# Patient Record
Sex: Female | Born: 1970 | Race: Black or African American | Hispanic: No | Marital: Married | Smoking: Never smoker
Health system: Southern US, Community
[De-identification: ages and names within clinical notes are randomized; demographics above are authoritative.]

## PROBLEM LIST (undated history)

## (undated) DIAGNOSIS — R569 Unspecified convulsions: Secondary | ICD-10-CM

---

## 2015-07-05 ENCOUNTER — Emergency Department (HOSPITAL_COMMUNITY)
Admission: EM | Admit: 2015-07-05 | Discharge: 2015-07-05 | Disposition: A | Discharge status: Home / Self Care | Payer: Self-pay | Attending: Emergency Medicine | Admitting: Emergency Medicine

## 2015-07-05 ENCOUNTER — Encounter (HOSPITAL_COMMUNITY): Payer: Self-pay | Admitting: Emergency Medicine

## 2015-07-05 ENCOUNTER — Emergency Department (HOSPITAL_COMMUNITY): Payer: Self-pay

## 2015-07-05 DIAGNOSIS — F1310 Sedative, hypnotic or anxiolytic abuse, uncomplicated: Secondary | ICD-10-CM | POA: Insufficient documentation

## 2015-07-05 DIAGNOSIS — R569 Unspecified convulsions: Secondary | ICD-10-CM | POA: Insufficient documentation

## 2015-07-05 DIAGNOSIS — Z3202 Encounter for pregnancy test, result negative: Secondary | ICD-10-CM | POA: Insufficient documentation

## 2015-07-05 DIAGNOSIS — M545 Low back pain, unspecified: Secondary | ICD-10-CM

## 2015-07-05 HISTORY — DX: Unspecified convulsions: R56.9

## 2015-07-05 LAB — URINALYSIS, ROUTINE W REFLEX MICROSCOPIC
BILIRUBIN URINE: NEGATIVE
Glucose, UA: NEGATIVE mg/dL
Hgb urine dipstick: NEGATIVE
Ketones, ur: NEGATIVE mg/dL
LEUKOCYTES UA: NEGATIVE
NITRITE: NEGATIVE
PH: 6.5 (ref 5.0–8.0)
Protein, ur: NEGATIVE mg/dL
SPECIFIC GRAVITY, URINE: 1.016 (ref 1.005–1.030)
UROBILINOGEN UA: 1 mg/dL (ref 0.0–1.0)

## 2015-07-05 LAB — CBC WITH DIFFERENTIAL/PLATELET
BASOS PCT: 0 %
Basophils Absolute: 0 10*3/uL (ref 0.0–0.1)
EOS ABS: 0.1 10*3/uL (ref 0.0–0.7)
EOS PCT: 2 %
HCT: 36.6 % (ref 36.0–46.0)
HEMOGLOBIN: 11.9 g/dL — AB (ref 12.0–15.0)
LYMPHS ABS: 2.1 10*3/uL (ref 0.7–4.0)
Lymphocytes Relative: 45 %
MCH: 28.6 pg (ref 26.0–34.0)
MCHC: 32.5 g/dL (ref 30.0–36.0)
MCV: 88 fL (ref 78.0–100.0)
MONOS PCT: 6 %
Monocytes Absolute: 0.3 10*3/uL (ref 0.1–1.0)
NEUTROS PCT: 47 %
Neutro Abs: 2.2 10*3/uL (ref 1.7–7.7)
PLATELETS: 245 10*3/uL (ref 150–400)
RBC: 4.16 MIL/uL (ref 3.87–5.11)
RDW: 14.5 % (ref 11.5–15.5)
WBC: 4.7 10*3/uL (ref 4.0–10.5)

## 2015-07-05 LAB — BASIC METABOLIC PANEL
Anion gap: 9 (ref 5–15)
BUN: 6 mg/dL (ref 6–20)
CALCIUM: 8.4 mg/dL — AB (ref 8.9–10.3)
CO2: 24 mmol/L (ref 22–32)
CREATININE: 0.75 mg/dL (ref 0.44–1.00)
Chloride: 106 mmol/L (ref 101–111)
Glucose, Bld: 122 mg/dL — ABNORMAL HIGH (ref 65–99)
Potassium: 3.9 mmol/L (ref 3.5–5.1)
SODIUM: 139 mmol/L (ref 135–145)

## 2015-07-05 LAB — RAPID URINE DRUG SCREEN, HOSP PERFORMED
Amphetamines: NOT DETECTED
Barbiturates: POSITIVE — AB
Benzodiazepines: POSITIVE — AB
Cocaine: NOT DETECTED
Opiates: NOT DETECTED
Tetrahydrocannabinol: NOT DETECTED

## 2015-07-05 LAB — PREGNANCY, URINE: Preg Test, Ur: NEGATIVE

## 2015-07-05 MED ORDER — MORPHINE SULFATE (PF) 4 MG/ML IV SOLN
4.0000 mg | Freq: Once | INTRAVENOUS | Status: AC
  Administered 2015-07-05: 4 mg via INTRAVENOUS
  Filled 2015-07-05: qty 1

## 2015-07-05 MED ORDER — PHENOBARBITAL 97.2 MG PO TABS
97.2000 mg | ORAL_TABLET | Freq: Once | ORAL | Status: AC
  Administered 2015-07-05: 97.2 mg via ORAL
  Filled 2015-07-05: qty 1

## 2015-07-05 MED ORDER — PROMETHAZINE HCL 25 MG/ML IJ SOLN
25.0000 mg | Freq: Once | INTRAMUSCULAR | Status: AC
  Administered 2015-07-05: 25 mg via INTRAVENOUS
  Filled 2015-07-05: qty 1

## 2015-07-05 MED ORDER — LEVETIRACETAM 750 MG PO TABS
750.0000 mg | ORAL_TABLET | Freq: Once | ORAL | Status: AC
  Administered 2015-07-05: 750 mg via ORAL
  Filled 2015-07-05: qty 1

## 2015-07-05 MED ORDER — SODIUM CHLORIDE 0.9 % IV BOLUS (SEPSIS)
1000.0000 mL | Freq: Once | INTRAVENOUS | Status: AC
  Administered 2015-07-05: 1000 mL via INTRAVENOUS

## 2015-07-05 NOTE — ED Notes (Signed)
Attempted to draw blood 1 attempt. RN Elsie Ra notified.

## 2015-07-05 NOTE — Discharge Instructions (Signed)
Take your seizure medication as directed. Follow up with your doctor for further evaluation.

## 2015-07-05 NOTE — ED Notes (Signed)
Patient is slurring words while talking. Patient is requesting more morphine. Patient states that she has percocet at home. She states that she pays this bill out of her pocket and should not be leaving the hospital in pain. She also stated that  of morphine is not enough. Patient wants [place] to give  of morphine. Patient was told she could have percocets but she stated she did not want that because it does not help.

## 2015-07-05 NOTE — ED Notes (Signed)
Pt.'s sister Armando Reichert cell no. [phone] to call once pt. Is ready for discharge.

## 2015-07-05 NOTE — ED Notes (Signed)
Family states patient had seizure. Patient was alert and oriented when EMS arrived. Patient stated she is having back pain.

## 2015-07-05 NOTE — ED Notes (Signed)
Patient stated that she called the doctor a white -------.

## 2015-07-05 NOTE — ED Provider Notes (Signed)
CSN: [id number]     Arrival date & time 07/05/15  0112 History   First MD Initiated Contact with Patient 07/05/15 0203     Chief Complaint  Patient presents with  . Seizures    Family states patient had seizure. Patient was alert and oriented when EMS arrived. Patient stated she is having back pain.      (Consider location/radiation/quality/duration/timing/severity/associated sxs/prior Treatment) Patient is a 44 y.o. female presenting with seizures. The history is provided by the patient and a relative. No language interpreter was used.  Seizures Seizure activity on arrival: no   Seizure type:  Grand mal Preceding symptoms: no sensation of an aura present, no dizziness, no euphoria, no headache, no hyperventilation, no nausea, no numbness, no panic and no vision change   Initial focality:  None Episode characteristics: generalized shaking   Postictal symptoms: no confusion, no memory loss and no somnolence   Return to baseline: yes   Severity:  Moderate Duration:  2 minutes Timing:  Once Number of seizures this episode:  1 Progression:  Resolved Context: not alcohol withdrawal, not developmental delay, not emotional upset, not family hx of seizures, not fever, not hydrocephalus, not intracranial lesion, not intracranial shunt, medical compliance, not possible medication ingestion, not pregnant and not stress   Context comment:  Missed a medication dose Recent head injury:  No recent head injuries PTA treatment:  None History of seizures: yes     Past Medical History  Diagnosis Date  . Seizure    No past surgical history on file. History reviewed. No pertinent family history. Social History  Substance Use Topics  . Smoking status: Never Smoker   . Smokeless tobacco: None  . Alcohol Use: No   OB History    No data available     Review of Systems  Neurological: Positive for seizures.  All other systems reviewed and are negative.     Allergies  Sulfa  antibiotics  Home Medications   Prior to Admission medications   Not on File   BP 113/73 mmHg  Pulse 72  Temp(Src) 97 F (36.1 C) (Oral)  Resp 20  SpO2 100%  LMP 06/03/2015 Physical Exam  Constitutional: She is oriented to person, place, and time. She appears well-developed and well-nourished. No distress.  HENT:  Head: Normocephalic and atraumatic.  Eyes: Conjunctivae and EOM are normal. Pupils are equal, round, and reactive to light.  Neck: Normal range of motion.  Cardiovascular: Normal rate and regular rhythm.  Exam reveals no gallop and no friction rub.   No murmur heard. Pulmonary/Chest: Effort normal and breath sounds normal. She has no wheezes. She has no rales. She exhibits no tenderness.  Abdominal: Soft. She exhibits no distension. There is no tenderness. There is no rebound.  Musculoskeletal: Normal range of motion.  Midline spine tenderness to palpation of entire spine.   Neurological: She is alert and oriented to person, place, and time. No cranial nerve deficit. Coordination normal.  Speech is goal-oriented. Moves limbs without ataxia.   Skin: Skin is warm and dry.  Psychiatric: She has a normal mood and affect. Her behavior is normal.  Nursing note and vitals reviewed.   ED Course  Procedures (including critical care time) Labs Review Labs Reviewed  CBC WITH DIFFERENTIAL/PLATELET - Abnormal; Notable for the following:    Hemoglobin 11.9 (*)    All other components within normal limits  BASIC METABOLIC PANEL - Abnormal; Notable for the following:    Glucose, Bld 122 (*)  Calcium 8.4 (*)    All other components within normal limits  URINE RAPID DRUG SCREEN, HOSP PERFORMED - Abnormal; Notable for the following:    Benzodiazepines POSITIVE (*)    Barbiturates POSITIVE (*)    All other components within normal limits  URINALYSIS, ROUTINE W REFLEX MICROSCOPIC (NOT AT [facility])  PREGNANCY, URINE    Imaging Review Dg Cervical Spine Complete  07/05/2015    CLINICAL DATA:  Neck pain after seizure and fall.  EXAM: CERVICAL SPINE  4+ VIEWS  COMPARISON:  None.  FINDINGS: Mild straightening of normal lordosis. Vertebral body heights are preserved. The dens is intact. Posterior elements appear well-aligned. There is no evidence of fracture. Disc space narrowing and endplate spurring at C4-C5, C5-C6, and C6-C7. No prevertebral soft tissue edema.  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 No evidence of fracture or subluxation.   Electronically Signed   By: Rubye Oaks M.D.   On: 07/05/2015 05:05   Dg Thoracic Spine 2 View  07/05/2015   CLINICAL DATA:  Thoracic back pain after seizure, fall.  EXAM: THORACIC SPINE 2 VIEWS  COMPARISON:  None.  FINDINGS: Upper thoracic spine partially obscured by soft tissue attenuation from body habitus. The alignment is maintained. Vertebral body heights are maintained. Minimal disc space narrowing. Posterior elements appear intact. There is no paravertebral soft tissue abnormality.  IMPRESSION: No fracture or subluxation of the thoracic spine.   Electronically Signed   By: Rubye Oaks M.D.   On: 07/05/2015 05:06   Dg Lumbar Spine Complete  07/05/2015   CLINICAL DATA:  Lumbar spine pain after seizure, fall.  EXAM: LUMBAR SPINE - COMPLETE 4+ VIEW  COMPARISON:  None.  FINDINGS: The alignment is maintained. Vertebral body heights are normal. Question of bilateral L5 pars interarticularis defects without listhesis. The posterior elements are intact. Disc spaces are preserved. No fracture. Sacroiliac joints are symmetric and normal.  IMPRESSION: 1. No acute fracture or subluxation of the lumbar spine. 2. Questionable bilateral L5 pars interarticularis defects without listhesis.   Electronically Signed   By: Rubye Oaks M.D.   On: 07/05/2015 05:07   Dg Shoulder Right  07/05/2015   CLINICAL DATA:  Right shoulder pain.  Post seizure.  Fall.  EXAM: RIGHT SHOULDER - 2+ VIEW  COMPARISON:  None.  FINDINGS: No  fracture or dislocation. There is proliferative change at the acromioclavicular joint. No abnormal soft tissue calcifications.  IMPRESSION: No acute fracture or dislocation.   Electronically Signed   By: Rubye Oaks M.D.   On: 07/05/2015 05:03   I have personally reviewed and evaluated these images and lab results as part of my medical decision-making.   EKG Interpretation None      MDM   Final diagnoses:  Seizure  Midline low back pain without sciatica    2:32 AM Labs pending. Vitals stable and patient afebrile.   5:47 AM Patient complaining of persistent back pain. Patient received her seizure medication as well as phenergan and morphine for pain. Patient is sleepy and slurring words. She loses her train of thought often during conversation and became emotional and started to cry during our conversation. I do not feel comfortable giving her additional pain medication because of her response to the first dose. Patient states she cannot take tylenol or ibuprofen. Patient's xrays and labs unremarkable. Patient will be discharged without further evaluation. Vitals stable and patient afebrile.     [id number] [place], PA-C 07/05/15 1610  Geoffery Lyons, MD 07/05/15 9604  Riley Lam  [place], MD 07/05/15 [phone]

## 2015-07-05 NOTE — ED Notes (Signed)
Bed: WA12 Expected date:  Expected time:  Means of arrival:  Comments: Ems : seizure 

## 2015-07-12 ENCOUNTER — Encounter (HOSPITAL_COMMUNITY): Payer: Self-pay | Admitting: *Deleted

## 2015-07-12 ENCOUNTER — Encounter (HOSPITAL_COMMUNITY): Payer: Self-pay | Admitting: Emergency Medicine

## 2015-07-12 ENCOUNTER — Emergency Department (HOSPITAL_COMMUNITY)
Admission: EM | Admit: 2015-07-12 | Discharge: 2015-07-12 | Disposition: A | Discharge status: Home / Self Care | Payer: Self-pay | Attending: Emergency Medicine | Admitting: Emergency Medicine

## 2015-07-12 DIAGNOSIS — Z79899 Other long term (current) drug therapy: Secondary | ICD-10-CM | POA: Insufficient documentation

## 2015-07-12 DIAGNOSIS — G40909 Epilepsy, unspecified, not intractable, without status epilepticus: Secondary | ICD-10-CM | POA: Insufficient documentation

## 2015-07-12 DIAGNOSIS — F1310 Sedative, hypnotic or anxiolytic abuse, uncomplicated: Secondary | ICD-10-CM | POA: Insufficient documentation

## 2015-07-12 DIAGNOSIS — R110 Nausea: Secondary | ICD-10-CM | POA: Insufficient documentation

## 2015-07-12 DIAGNOSIS — M549 Dorsalgia, unspecified: Secondary | ICD-10-CM | POA: Insufficient documentation

## 2015-07-12 DIAGNOSIS — R569 Unspecified convulsions: Secondary | ICD-10-CM | POA: Insufficient documentation

## 2015-07-12 DIAGNOSIS — R404 Transient alteration of awareness: Secondary | ICD-10-CM | POA: Insufficient documentation

## 2015-07-12 LAB — BASIC METABOLIC PANEL
ANION GAP: 6 (ref 5–15)
BUN: 5 mg/dL — AB (ref 6–20)
CALCIUM: 8.6 mg/dL — AB (ref 8.9–10.3)
CO2: 24 mmol/L (ref 22–32)
Chloride: 106 mmol/L (ref 101–111)
Creatinine, Ser: 0.87 mg/dL (ref 0.44–1.00)
GFR calc Af Amer: >60 mL/min (ref >60)
GLUCOSE: 95 mg/dL (ref 65–99)
Potassium: 4 mmol/L (ref 3.5–5.1)
SODIUM: 136 mmol/L (ref 135–145)

## 2015-07-12 LAB — CBC WITH DIFFERENTIAL/PLATELET
BASOS ABS: 0 10*3/uL (ref 0.0–0.1)
BASOS PCT: 0 %
EOS ABS: 0.1 10*3/uL (ref 0.0–0.7)
EOS PCT: 2 %
HCT: 37.3 % (ref 36.0–46.0)
Hemoglobin: 12.1 g/dL (ref 12.0–15.0)
LYMPHS PCT: 51 %
Lymphs Abs: 2 10*3/uL (ref 0.7–4.0)
MCH: 28.4 pg (ref 26.0–34.0)
MCHC: 32.4 g/dL (ref 30.0–36.0)
MCV: 87.6 fL (ref 78.0–100.0)
MONO ABS: 0.2 10*3/uL (ref 0.1–1.0)
Monocytes Relative: 6 %
Neutro Abs: 1.6 10*3/uL — ABNORMAL LOW (ref 1.7–7.7)
Neutrophils Relative %: 41 %
PLATELETS: 262 10*3/uL (ref 150–400)
RBC: 4.26 MIL/uL (ref 3.87–5.11)
RDW: 14.2 % (ref 11.5–15.5)
WBC: 3.9 10*3/uL — AB (ref 4.0–10.5)

## 2015-07-12 LAB — RAPID URINE DRUG SCREEN, HOSP PERFORMED
Amphetamines: NOT DETECTED
BENZODIAZEPINES: POSITIVE — AB
Barbiturates: POSITIVE — AB
Cocaine: NOT DETECTED
Opiates: NOT DETECTED
Tetrahydrocannabinol: NOT DETECTED

## 2015-07-12 LAB — PHENOBARBITAL LEVEL: PHENOBARBITAL: 39.8 ug/mL (ref 15.0–40.0)

## 2015-07-12 LAB — CBG MONITORING, ED: GLUCOSE-CAPILLARY: 89 mg/dL (ref 65–99)

## 2015-07-12 MED ORDER — PROMETHAZINE HCL 25 MG/ML IJ SOLN
25.0000 mg | Freq: Once | INTRAMUSCULAR | Status: AC
  Administered 2015-07-12: 25 mg via INTRAVENOUS
  Filled 2015-07-12: qty 1

## 2015-07-12 MED ORDER — MORPHINE SULFATE (PF) 4 MG/ML IV SOLN
4.0000 mg | Freq: Once | INTRAVENOUS | Status: AC
  Administered 2015-07-12: 4 mg via INTRAVENOUS
  Filled 2015-07-12: qty 1

## 2015-07-12 NOTE — ED Provider Notes (Signed)
CSN: [id number]     Arrival date & time 07/12/15  1733 History   First MD Initiated Contact with Patient 07/12/15 1758     Chief Complaint  Patient presents with  . Re-evaluation    ? Seizure     (Consider location/radiation/quality/duration/timing/severity/associated sxs/prior Treatment) The history is provided by the patient.  Patient w hx seizure, c/o being discharged from ER earlier today. [place], and laid down on bench there. Patient was then brought by ems back to ED.  Pt states she was d/c too soon after getting morphine and phenergan (on review of records, received no meds earlier today, but did get those meds on prior ed visit 9/18).  Pt very difficult historian, states from [place], then states from [place], [place] - when asked about current address, [place], she seems confused.  Pt states she is visiting her 'fraternalized mother', part of the 'order of the [place]'.   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 Pt difficult historian, moves from one unrelated thought to next, uncooperative w many questions - level 5 caveat.      Past Medical History  Diagnosis Date  . Seizure    History reviewed. No pertinent past surgical history. No family history on file. Social History  Substance Use Topics  . Smoking status: Never Smoker   . Smokeless tobacco: None  . Alcohol Use: No   OB History    No data available     Review of Systems  Constitutional: Negative for fever and chills.  HENT: Negative for sore throat.   Eyes: Negative for redness.  Respiratory: Negative for shortness of breath.   Cardiovascular: Negative for chest pain.  Gastrointestinal: Negative for vomiting and abdominal pain.  Genitourinary: Negative for flank pain.  Musculoskeletal: Negative for back pain and neck pain.  Skin: Negative for rash.  Neurological: Negative for headaches.  Hematological: Does not bruise/bleed easily.  Psychiatric/Behavioral: Negative for suicidal  ideas and dysphoric mood.      Allergies  Sulfa antibiotics  Home Medications   Prior to Admission medications   Medication Sig Start Date End Date Taking? Authorizing Aletha Allebach  levETIRAcetam (KEPPRA) 500 MG tablet Take 750 mg by mouth 2 (two) times daily. 06/08/15   Historical Keondre Markson, MD  levETIRAcetam (KEPPRA) 750 MG tablet TK 2 TS PO BID 06/15/15   Historical Antoino Westhoff, MD  LORazepam (ATIVAN) 2 MG tablet TK 1 T PO TID 06/08/15   Historical Edem Tiegs, MD  meclizine (ANTIVERT) 25 MG tablet TK 1 TS PO TID 06/12/15   Historical Abbee Cremeens, MD  oxyCODONE-acetaminophen (PERCOCET) 10-325 MG per tablet TK 1 T PO TID 06/15/15   Historical Katasha Riga, MD  PHENObarbital (LUMINAL) 100 MG tablet TK 1 T PO BID 06/15/15   Historical Anil Havard, MD   BP 124/78 mmHg  Pulse 81  Temp(Src) 97.8 F (36.6 C) (Oral)  Resp 18  SpO2 100%  LMP 06/03/2015 (LMP Unknown) Physical Exam  Constitutional: She appears well-developed and well-nourished. No distress.  HENT:  Head: Atraumatic.  Eyes: Conjunctivae are normal. Pupils are equal, round, and reactive to light. No scleral icterus.  Neck: Neck supple. No tracheal deviation present.  No stiffness or rigidity  Cardiovascular: Normal rate, regular rhythm, normal heart sounds and intact distal pulses.   Pulmonary/Chest: Effort normal and breath sounds normal. No respiratory distress.  Abdominal: Soft. Normal appearance and bowel sounds are normal. She exhibits no distension. There is no tenderness.  Musculoskeletal: She exhibits no edema.  Neurological: She is  alert.  Oriented to person, place. Ambulates w steady gait.  Skin: Skin is warm and dry. No rash noted. She is not diaphoretic.  Psychiatric:  Pt states irritated, upset. Denies feeling depressed, denies SI.       Nursing note and vitals reviewed.   ED Course  Procedures (including critical care time) Labs Review    Results for orders placed or performed during the hospital encounter of  07/12/15  Phenobarbital Level  (if patient is taking this medication)  Result Value Ref Range   Phenobarbital 39.8 15.0 - 40.0 ug/mL  CBC with Differential  Result Value Ref Range   WBC 3.9 (L) 4.0 - 10.5 K/uL   RBC 4.26 3.87 - 5.11 MIL/uL   Hemoglobin 12.1 12.0 - 15.0 g/dL   HCT 60.4 54.0 - 98.1 %   MCV 87.6 78.0 - 100.0 fL   MCH 28.4 26.0 - 34.0 pg   MCHC 32.4 30.0 - 36.0 g/dL   RDW 19.1 47.8 - 29.5 %   Platelets 262 150 - 400 K/uL   Neutrophils Relative % 41 %   Neutro Abs 1.6 (L) 1.7 - 7.7 K/uL   Lymphocytes Relative 51 %   Lymphs Abs 2.0 0.7 - 4.0 K/uL   Monocytes Relative 6 %   Monocytes Absolute 0.2 0.1 - 1.0 K/uL   Eosinophils Relative 2 %   Eosinophils Absolute 0.1 0.0 - 0.7 K/uL   Basophils Relative 0 %   Basophils Absolute 0.0 0.0 - 0.1 K/uL  Basic metabolic panel  Result Value Ref Range   Sodium 136 135 - 145 mmol/L   Potassium 4.0 3.5 - 5.1 mmol/L   Chloride 106 101 - 111 mmol/L   CO2 24 22 - 32 mmol/L   Glucose, Bld 95 65 - 99 mg/dL   BUN 5 (L) 6 - 20 mg/dL   Creatinine, Ser 6.21 0.44 - 1.00 mg/dL   Calcium 8.6 (L) 8.9 - 10.3 mg/dL   GFR calc non Af Amer >60 >60 mL/min   GFR calc Af Amer >60 >60 mL/min   Anion gap 6 5 - 15  Urine rapid drug screen (hosp performed)  Result Value Ref Range   Opiates NONE DETECTED NONE DETECTED   Cocaine NONE DETECTED NONE DETECTED   Benzodiazepines POSITIVE (A) NONE DETECTED   Amphetamines NONE DETECTED NONE DETECTED   Tetrahydrocannabinol NONE DETECTED NONE DETECTED   Barbiturates POSITIVE (A) NONE DETECTED  CBG monitoring, ED  Result Value Ref Range   Glucose-Capillary 89 65 - 99 mg/dL   Dg Cervical Spine Complete  07/05/2015   CLINICAL DATA:  Neck pain after seizure and fall.  EXAM: CERVICAL SPINE  4+ VIEWS  COMPARISON:  None.  FINDINGS: Mild straightening of normal lordosis. Vertebral body heights are preserved. The dens is intact. Posterior elements appear well-aligned. There is no evidence of fracture. Disc space  narrowing and endplate spurring at C4-C5, C5-C6, and C6-C7. No prevertebral soft tissue edema.  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 No evidence of fracture or subluxation.   Electronically Signed   By: Rubye Oaks M.D.   On: 07/05/2015 05:05   Dg Thoracic Spine 2 View  07/05/2015   CLINICAL DATA:  Thoracic back pain after seizure, fall.  EXAM: THORACIC SPINE 2 VIEWS  COMPARISON:  None.  FINDINGS: Upper thoracic spine partially obscured by soft tissue attenuation from body habitus. The alignment is maintained. Vertebral body heights are maintained. Minimal disc space narrowing. Posterior elements appear intact. There is no paravertebral  soft tissue abnormality.  IMPRESSION: No fracture or subluxation of the thoracic spine.   Electronically Signed   By: Rubye Oaks M.D.   On: 07/05/2015 05:06   Dg Lumbar Spine Complete  07/05/2015   CLINICAL DATA:  Lumbar spine pain after seizure, fall.  EXAM: LUMBAR SPINE - COMPLETE 4+ VIEW  COMPARISON:  None.  FINDINGS: The alignment is maintained. Vertebral body heights are normal. Question of bilateral L5 pars interarticularis defects without listhesis. The posterior elements are intact. Disc spaces are preserved. No fracture. Sacroiliac joints are symmetric and normal.  IMPRESSION: 1. No acute fracture or subluxation of the lumbar spine. 2. Questionable bilateral L5 pars interarticularis defects without listhesis.   Electronically Signed   By: Rubye Oaks M.D.   On: 07/05/2015 05:07   Dg Shoulder Right  07/05/2015   CLINICAL DATA:  Right shoulder pain.  Post seizure.  Fall.  EXAM: RIGHT SHOULDER - 2+ VIEW  COMPARISON:  None.  FINDINGS: No fracture or dislocation. There is proliferative change at the acromioclavicular joint. No abnormal soft tissue calcifications.  IMPRESSION: No acute fracture or dislocation.   Electronically Signed   By: Rubye Oaks M.D.   On: 07/05/2015 05:03      MDM   Reviewed nursing notes and prior  charts for additional history.   Recent labs reviewed.  Pt indicates she just needs help getting to home of friend, Durwin Nora, who is her friend and fraternal mother.  She provided number.  Pts contact reached, who verifies that she is expecting pt, but that she has no transportation to get pt to her home, states pt is welcome there.  SW contacted, will provide transportation for pt.  Vitals normal, pt denies mental or physical symptom or problem currently, and requests d/c.      Cathren Laine, MD 07/12/15 1904

## 2015-07-12 NOTE — ED Notes (Signed)
Pt stated to RN [place] that she woke up confused, had wet self, and felt like she had had a seizure. Patient stated to [facility] that she had a hx of seizure activity. Patient is alert, oriented x 4.

## 2015-07-12 NOTE — ED Provider Notes (Signed)
CSN: [id number]     Arrival date & time 07/12/15  0426 History   First MD Initiated Contact with Patient 07/12/15 0601     Chief Complaint  Patient presents with  . Seizures     (Consider location/radiation/quality/duration/timing/severity/associated sxs/prior Treatment) HPI Comments: Pt is a 44 yo female with PMH of seizures who presents to the ED via EMS with complaint of seizure. Pt states yesterday around 11am she started to have a HA which she reports is her "aura" and then went to lay down to try to go to sleep and fell asleep till 4am. She notes she woke up confused, had wet herself, had pain to her back and legs and states she "felt like I had a seizure". Endorses fatigue, back pain, leg pain, nausea. Denies fever, headache, dizziness, visual changes, slurred speech, weakness, numbness, tingling, SOB, CP, abdominal pain, vomiting, diarrhea, urinary sxs. Denies head injury. She reports that for the past 6 weeks she has only been taking 1/2 the dose of her Keppra and Phenobarbital due to not feeling right and it affecting her work when she takes the full dose prescribed by her neurologist. Pt was last seen in our ED on 07/05/15 for seizure. Pt denies alcohol or drug use.   Patient is a 44 y.o. female presenting with seizures.  Seizures   Past Medical History  Diagnosis Date  . Seizure    History reviewed. No pertinent past surgical history. History reviewed. No pertinent family history. Social History  Substance Use Topics  . Smoking status: Never Smoker   . Smokeless tobacco: None  . Alcohol Use: No   OB History    No data available     Review of Systems  Gastrointestinal: Positive for nausea.  Musculoskeletal: Positive for myalgias and back pain.  Neurological: Positive for seizures.      Allergies  Sulfa antibiotics  Home Medications   Prior to Admission medications   Not on File   BP 112/78 mmHg  Pulse 76  Temp(Src) 98.7 F (37.1 C) (Oral)  Resp 12  SpO2  100%  LMP 06/03/2015 (LMP Unknown) Physical Exam  Constitutional: She is oriented to person, place, and time. She appears well-developed and well-nourished. No distress.  Pt appears sleepy with slowed speech but pt is alert and orientated x4.   HENT:  Head: Normocephalic and atraumatic.  Mouth/Throat: Oropharynx is clear and moist. No oropharyngeal exudate.  Eyes: Conjunctivae and EOM are normal. Pupils are equal, round, and reactive to light. Right eye exhibits no discharge. Left eye exhibits no discharge. No scleral icterus.  Neck: Normal range of motion. Neck supple.  Cardiovascular: Normal rate, regular rhythm, normal heart sounds and intact distal pulses.   Pulmonary/Chest: Effort normal and breath sounds normal. She has no wheezes. She has no rales. She exhibits no tenderness.  Abdominal: Soft. Bowel sounds are normal. She exhibits no distension and no mass. There is no tenderness. There is no rebound and no guarding.  Musculoskeletal: Normal range of motion. She exhibits no edema.  Midline and paraspinal tenderness with palpation of entire spine. BLE tenderness with light palpation. No abrasions, contusions or lacerations.  Lymphadenopathy:    She has no cervical adenopathy.  Neurological: She is alert and oriented to person, place, and time. She has normal strength. No cranial nerve deficit or sensory deficit. She displays a negative Romberg sign. Coordination normal.  Skin: Skin is warm and dry. She is not diaphoretic.  Nursing note and vitals reviewed.   ED Course  Procedures (including critical care time) Labs Review Labs Reviewed  PHENOBARBITAL LEVEL  CBG MONITORING, ED    Imaging Review No results found. I have personally reviewed and evaluated these images and lab results as part of my medical decision-making.  Filed Vitals:   07/12/15 1117  BP: 124/86  Pulse: 76  Temp:   Resp: 16   Meds given in ED:  Medications  promethazine (PHENERGAN) injection 25 mg (25  mg Intravenous Given 07/12/15 0816)  morphine 4 MG/ML injection 4 mg (4 mg Intravenous Given 07/12/15 0816)    Discharge Medication List as of 07/12/2015 11:43 AM       MDM   Final diagnoses:  Seizure    Pt presents with seizure. Endorses nausea, back and leg pain. Pt reports only taking 1/2 her dose of Keppra and Phenobarbital because she feels "weird" when she takes the entire prescribed dose. Pt last saw Neurolgoist a few months ago. She was seen in our ED on 9/18 for seizure and was d/c home. VSS. Pt appears fatigued with slowed speech. Back and BLE tender to light palpation. Pt denies alcohol or drug use. Pt given phenergan (due to requesting phenergan and stating that other antiemetics do not work for her) and morphine for pain. She refused tylenol, motrin or toradol.   Labs unremarkable. UDS positive for benzos and barbituates. Pt refused to have lab work done for EtOH level and reports she does not drink alcohol.  9:25 - Pt reports she is feeling better. Pt continues to have slowed speech and appears sleepy. Pt is still A&Ox4 and follows commands. Pt reports her next appointment with her Neurologist is not until November.  10:45 - Pt reports her pain is improved. Pt is sleepy and has slurred speech. No neuro deficits. Plan to d/c pt home and advised pt to follow up with her Neurologist this week. Pt advised to continue taking her prescribed dose of antiepileptic medications. She reports her friend will be able to give her a ride home.   Nursing staff and myself tried made multiple attempts to try to find the pt's friend's number without success. Nurse reports the pt had spoke with multiple people on the phone asking for rides. Pt was then moved to the lobby to wait for her ride after the nursing staff had continued to try to find resources for transportation for the pt.   Satira Sark [place], [place] 07/12/15 1623  Jerelyn Scott, MD 07/12/15 [phone]

## 2015-07-12 NOTE — ED Notes (Signed)
PA Joni Reining reports patient was talking then all of a sudden she became very sleepy. PA leaves door open for visualization of patient.

## 2015-07-12 NOTE — ED Notes (Addendum)
Delay in discharge-Multiple attempts made to find missing "paper with friends number" (EDPA, myself, Gerilyn Pilgrim NT) without success. Pt speaking with multiple persons on the phone to attempt to find a ride home.

## 2015-07-12 NOTE — ED Notes (Signed)
Patient states that she is currently taking: keppra, Phenobarbitol, Lorazepam, and Oxycodone.  Patient states that she is taking all medications as prescribed, but has not followed up with neurology since her last visit.

## 2015-07-12 NOTE — ED Notes (Signed)
Pt rude and stating "you are dismissed, I am not talking to you" at this time we have exhausted finding resources for transport home, pt aware she can call for ride from lobby. Pt noncompliant, security and [employer] escorting pt from premises. Would not sign discharge paperwork.

## 2015-07-12 NOTE — ED Notes (Signed)
MD at bedside. 

## 2015-07-12 NOTE — Discharge Instructions (Signed)
Please continue taking your seizure medications as prescribed.  Please follow up with your Neurologist this week.  Please return to the Emergency Department if symptoms worsen or new onset of fever, headache, visual changes, numbness, tingling, weakness.

## 2015-07-12 NOTE — ED Notes (Signed)
PA at bedside.

## 2015-07-12 NOTE — ED Notes (Addendum)
Pt refused lab work.  RN aware.  

## 2015-07-12 NOTE — ED Notes (Signed)
Bed: ZO10 Expected date: 07/12/15 Expected time: 4:22 AM Means of arrival: Ambulance Comments: Seizure

## 2015-07-12 NOTE — ED Notes (Signed)
PA still at bedside.

## 2015-07-12 NOTE — Discharge Instructions (Signed)
It was our pleasure to provide your ER care today - we hope that you feel better.  Follow up with primary care doctor in the coming week.  Return to ER if worse, new symptoms, fevers, seizures, medical emergency, other concern.

## 2015-07-12 NOTE — ED Notes (Addendum)
EDPA  at bedside. Pt offered tylenol, motrin and/or torodol for pain. Pt declined.

## 2015-07-12 NOTE — ED Notes (Addendum)
EMS reports [facility] cleared hospital, saw pt lying on bus stop bench, called [facility] since pt would not respond to them, upon arrival ? Seizure, would not respond to them, at charge desk, pt heard CN voice, immediately looked up and told CN not to talk to her or see her, no slurred speech, able to transfer to triage chair without difficulty, states to NT she is not leaving here again. No drooling nor inconitnence noted when pt arrived.

## 2015-07-12 NOTE — ED Notes (Addendum)
Pt upset that the physician has not seen her yet. It was explained that the physician will around as soon as possible. She states " I could be dead in here". This RN explained that she is on the monitor and we are keeping an eye on her. This RN offered patient Zofran while she waits however, patient states "Zofran doesn't work for me, I have a whole bag of it". This RN explained that it can be given IV. Pt states "no I don't want it".

## 2015-07-12 NOTE — ED Notes (Signed)
ED PA at bedside

## 2017-03-16 IMAGING — CR DG SHOULDER 2+V*R*
2 series · 2 of 2 positions shown · non-contrast
Comparison: None.

CLINICAL DATA: Right shoulder pain.  Post seizure.  Fall.

EXAM:
RIGHT SHOULDER - 2+ VIEW

[t shoulder external right]
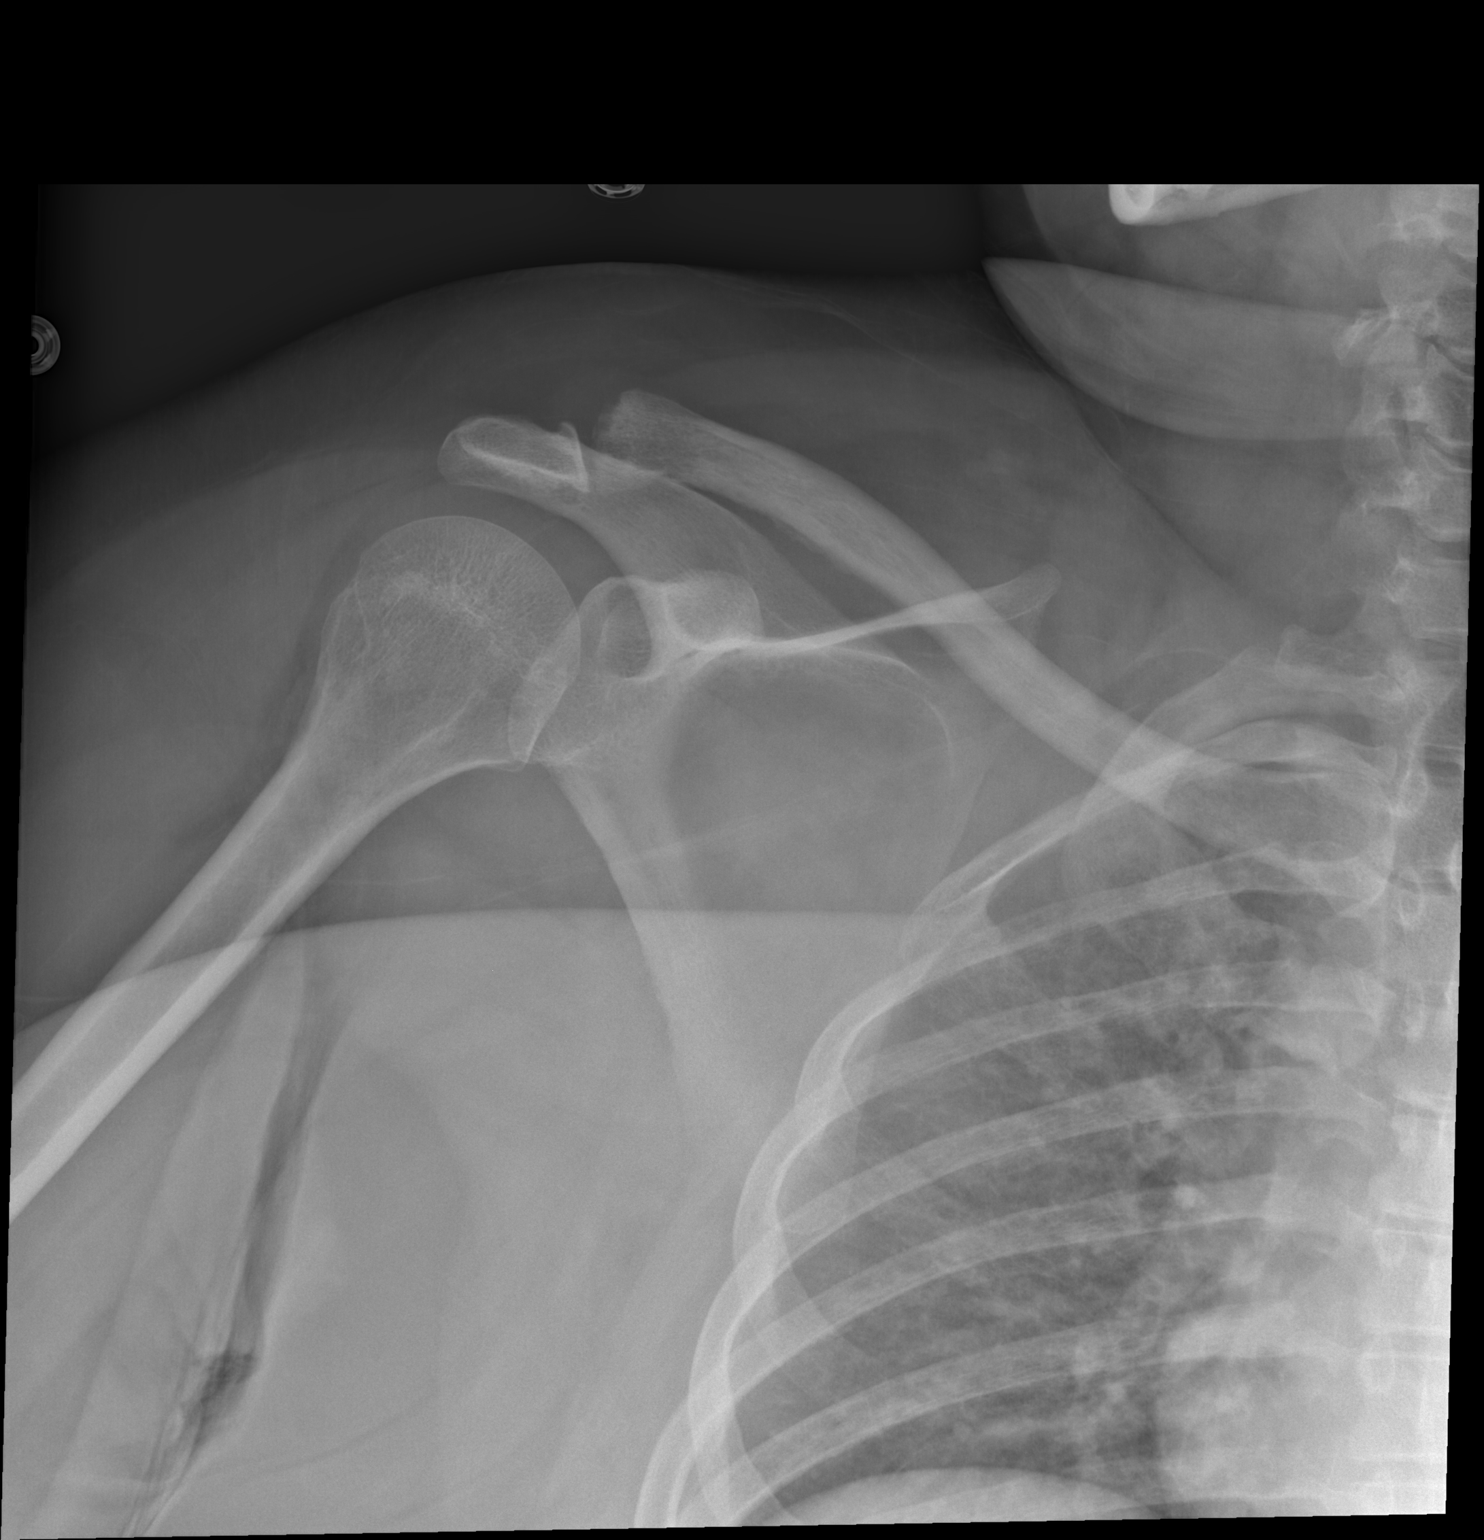

[t shoulder y-view right]
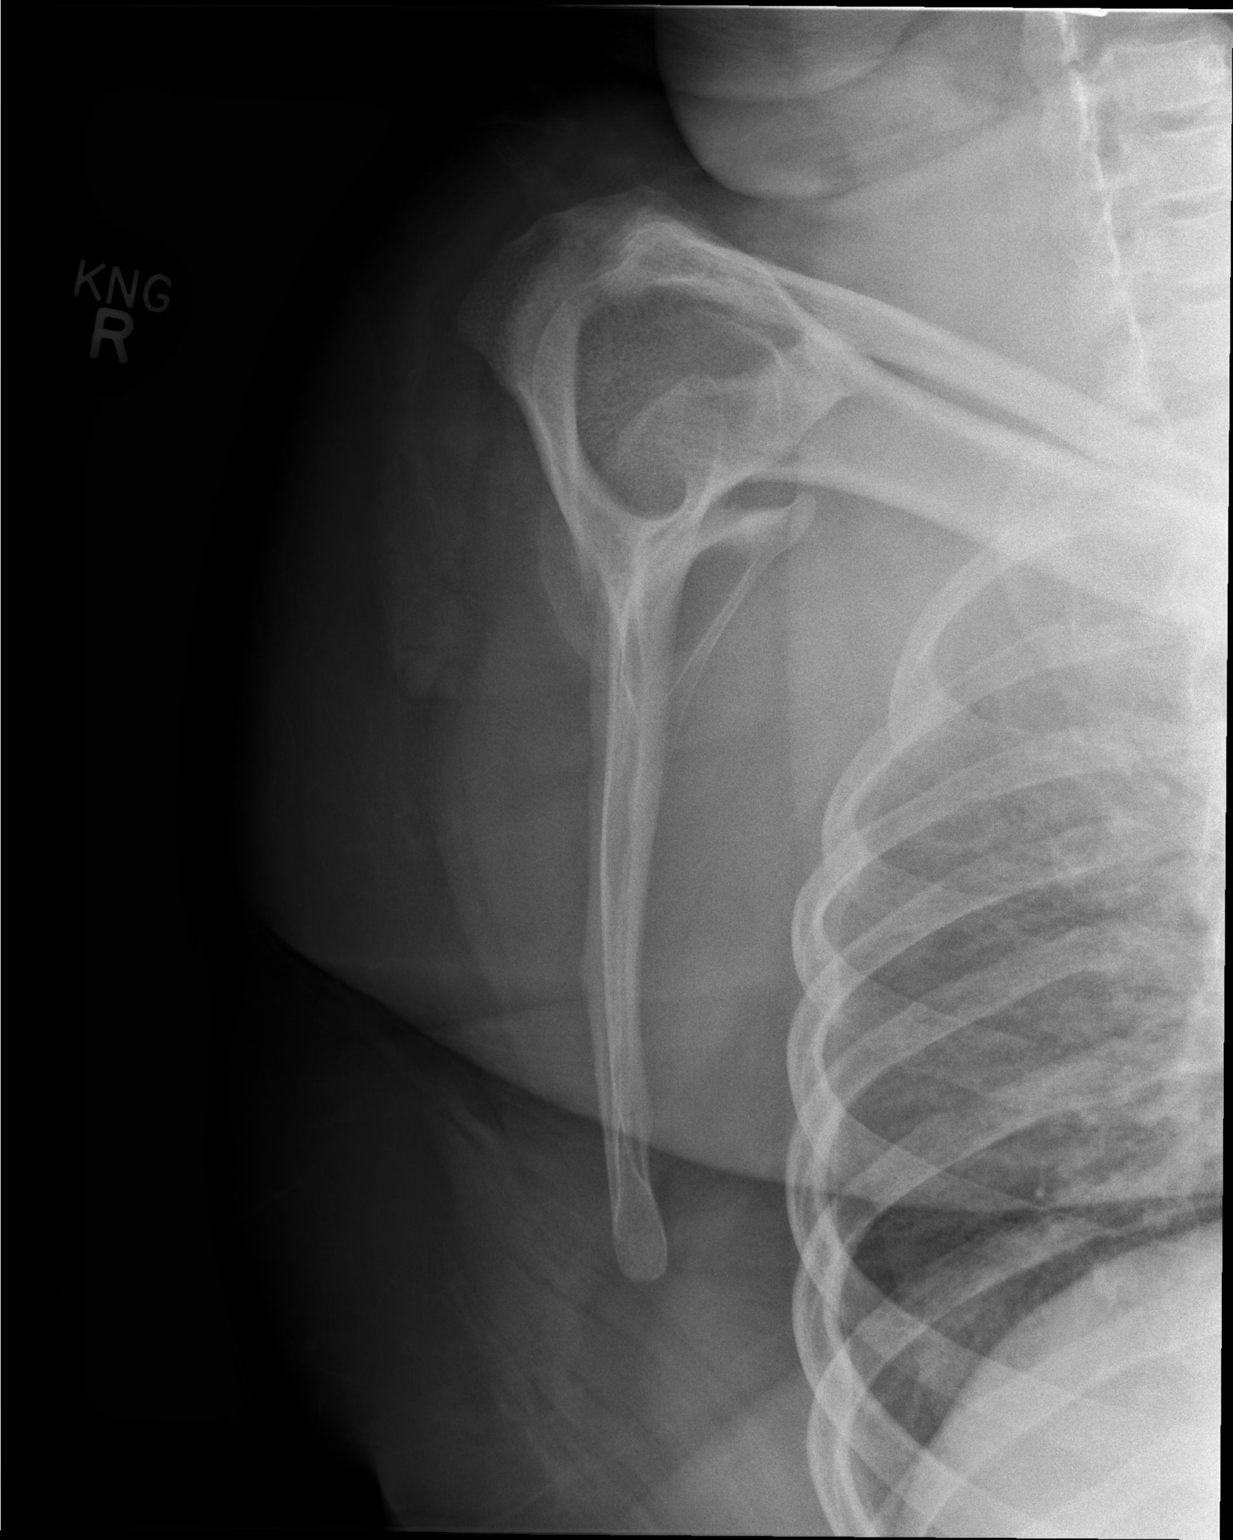

[2 of 2 positions shown; findings below may reference images not displayed]

FINDINGS: No fracture or dislocation. There is proliferative change at the
acromioclavicular joint. No abnormal soft tissue calcifications.
IMPRESSION: No acute fracture or dislocation.
